# Patient Record
Sex: Male | Born: 1970 | Race: White | Hispanic: No | Marital: Married | Smoking: Never smoker
Health system: Southern US, Community
[De-identification: ages and names within clinical notes are randomized; demographics above are authoritative.]

## PROBLEM LIST (undated history)

## (undated) DIAGNOSIS — K219 Gastro-esophageal reflux disease without esophagitis: Secondary | ICD-10-CM

## (undated) DIAGNOSIS — E7800 Pure hypercholesterolemia, unspecified: Secondary | ICD-10-CM

## (undated) DIAGNOSIS — I10 Essential (primary) hypertension: Secondary | ICD-10-CM

## (undated) HISTORY — DX: Gastro-esophageal reflux disease without esophagitis: K21.9

## (undated) HISTORY — PX: OTHER SURGICAL HISTORY: SHX169

## (undated) HISTORY — DX: Essential (primary) hypertension: I10

## (undated) HISTORY — DX: Pure hypercholesterolemia, unspecified: E78.00

---

## 2012-09-14 ENCOUNTER — Emergency Department (HOSPITAL_BASED_OUTPATIENT_CLINIC_OR_DEPARTMENT_OTHER)
Admission: EM | Admit: 2012-09-14 | Discharge: 2012-09-14 | Disposition: A | Discharge status: Home / Self Care | Payer: BC Managed Care – PPO | Attending: Emergency Medicine | Admitting: Emergency Medicine

## 2012-09-14 ENCOUNTER — Emergency Department (HOSPITAL_BASED_OUTPATIENT_CLINIC_OR_DEPARTMENT_OTHER): Payer: BC Managed Care – PPO

## 2012-09-14 ENCOUNTER — Encounter (HOSPITAL_BASED_OUTPATIENT_CLINIC_OR_DEPARTMENT_OTHER): Payer: Self-pay | Admitting: *Deleted

## 2012-09-14 DIAGNOSIS — R209 Unspecified disturbances of skin sensation: Secondary | ICD-10-CM | POA: Insufficient documentation

## 2012-09-14 DIAGNOSIS — S43499A Other sprain of unspecified shoulder joint, initial encounter: Secondary | ICD-10-CM | POA: Insufficient documentation

## 2012-09-14 DIAGNOSIS — Y92838 Other recreation area as the place of occurrence of the external cause: Secondary | ICD-10-CM | POA: Insufficient documentation

## 2012-09-14 DIAGNOSIS — Y9364 Activity, baseball: Secondary | ICD-10-CM | POA: Insufficient documentation

## 2012-09-14 DIAGNOSIS — S46819A Strain of other muscles, fascia and tendons at shoulder and upper arm level, unspecified arm, initial encounter: Secondary | ICD-10-CM | POA: Insufficient documentation

## 2012-09-14 DIAGNOSIS — W219XXA Striking against or struck by unspecified sports equipment, initial encounter: Secondary | ICD-10-CM | POA: Insufficient documentation

## 2012-09-14 DIAGNOSIS — X500XXA Overexertion from strenuous movement or load, initial encounter: Secondary | ICD-10-CM | POA: Insufficient documentation

## 2012-09-14 DIAGNOSIS — S46212A Strain of muscle, fascia and tendon of other parts of biceps, left arm, initial encounter: Secondary | ICD-10-CM

## 2012-09-14 DIAGNOSIS — Y9239 Other specified sports and athletic area as the place of occurrence of the external cause: Secondary | ICD-10-CM | POA: Insufficient documentation

## 2012-09-14 MED ORDER — OXYCODONE-ACETAMINOPHEN 5-325 MG PO TABS: 1.0000 | ORAL_TABLET | ORAL | Status: DC | PRN

## 2012-09-14 NOTE — ED Provider Notes (Signed)
Medical screening examination/treatment/procedure(s) were conducted as a shared visit with non-physician practitioner(s) and myself.  I personally evaluated the patient during the encounter  L upper arm pain after swing baseball bat.  Deformity to L bicep, dimpling to skin. +2 Radial pulse, cardinal hand movements intact, flexion and extension intact with pain. Concern for biceps/tendon rupture.  Glynn Octave, MD 09/14/12 [phone]

## 2012-09-14 NOTE — ED Provider Notes (Signed)
History     CSN: [id number]  Arrival date & time 09/14/12  2008   First MD Initiated Contact with Patient 09/14/12 2029      Chief Complaint  Patient presents with  . Arm Injury    (Consider location/radiation/quality/duration/timing/severity/associated sxs/prior treatment) Patient is a 42 y.o. male presenting with arm injury. The history is provided by the patient.  Arm Injury  Patient presents to the ED for left upper arm injury.  Patient states he was at softball practice earlier, he swung a bat and hit himself in his left upper arm with the bat. Immediately he felt a "pop" and his bicep "looked like a softball."  He endorses some paresthesias of left thumb, index, and middle fingers.  Denies any numbness.  Patient has never had a left arm injury before. He is not established with orthopedics. He is right-hand dominant.  History reviewed. No pertinent past medical history.  History reviewed. No pertinent past surgical history.  History reviewed. No pertinent family history.  History  Substance Use Topics  . Smoking status: Never Smoker   . Smokeless tobacco: Not on file  . Alcohol Use: No      Review of Systems  Musculoskeletal: Positive for myalgias.  All other systems reviewed and are negative.    Allergies  Review of patient's allergies indicates no known allergies.  Home Medications   Current Outpatient Rx  Name  Route  Sig  Dispense  Refill  . loratadine (CLARITIN) 10 MG tablet   Oral   Take 10 mg by mouth daily.           BP 128/87  Pulse 81  Temp(Src) 98.4 F (36.9 C) (Oral)  Resp 16  Ht 5\' 8"  (1.727 m)  Wt 200 lb (90.719 kg)  BMI 30.42 kg/m2  SpO2 100%  Physical Exam  Nursing note and vitals reviewed. Constitutional: He is oriented to person, place, and time. He appears well-developed and well-nourished. No distress.  HENT:  Head: Normocephalic and atraumatic.  Eyes: Conjunctivae and EOM are normal.  Neck: Normal range of motion. Neck  supple.  Cardiovascular: Normal rate, regular rhythm and normal heart sounds.   Pulmonary/Chest: Effort normal and breath sounds normal. No respiratory distress.  Musculoskeletal: Normal range of motion.       Left upper arm: He exhibits tenderness, swelling and deformity. He exhibits no bony tenderness, no edema and no laceration.       Arms: TTP of left distal bicep with "popeye" appearance of bicep muscle- concern for bicep rupture; strong radial pulse and cap refill, sensation intact  Neurological: He is alert and oriented to person, place, and time.  Skin: Skin is warm and dry.  Psychiatric: He has a normal mood and affect.    ED Course  Procedures (including critical care time)  Labs Reviewed - No data to display Dg Elbow Complete Left  09/14/2012   *RADIOLOGY REPORT*  Clinical Data: Left arm pain.  LEFT ELBOW - COMPLETE 3+ VIEW  Comparison: No priors.  Findings: Four views of the left elbow demonstrate no acute displaced fracture, subluxation, dislocation, joint or soft tissue abnormality.  IMPRESSION: No acute radiographic abnormality of the left elbow.   Original Report Authenticated By: Trudie Reed, M.D.   Dg Humerus Left  09/14/2012   *RADIOLOGY REPORT*  Clinical Data: Left upper arm pain.  LEFT HUMERUS - 2+ VIEW  Comparison: No priors.  Findings: AP and lateral views of the left humerus demonstrate no acute displaced fracture.  No definite soft tissue abnormality appreciated.  IMPRESSION: 1.  No acute radiographic abnormality of the left humerus.   Original Report Authenticated By: Trudie Reed, M.D.     1. Biceps tendon rupture, left, initial encounter       MDM   Pt appears to have left distal bicep rupture.  Consulted ortho, Dr. Victorino Dike, he will schedule pt appt for appt and MRI tomorrow for further evaluation.  Arm sling placed.  Rx percocet.  Discussed plan with pt, he agreed.  Return precautions advised.         Garlon Hatchet, PA-C 09/14/12 2212

## 2012-09-14 NOTE — ED Notes (Signed)
Pt c/o left upper arm injury from baseball bat while playing ball x 1 hr ago

## 2014-01-10 IMAGING — CR DG ELBOW COMPLETE 3+V*L*
4 series · 4 of 4 positions shown · non-contrast
Comparison: No priors.

CLINICAL DATA: Left arm pain.

LEFT ELBOW - COMPLETE 3+ VIEW

[x elbow joint ap left]
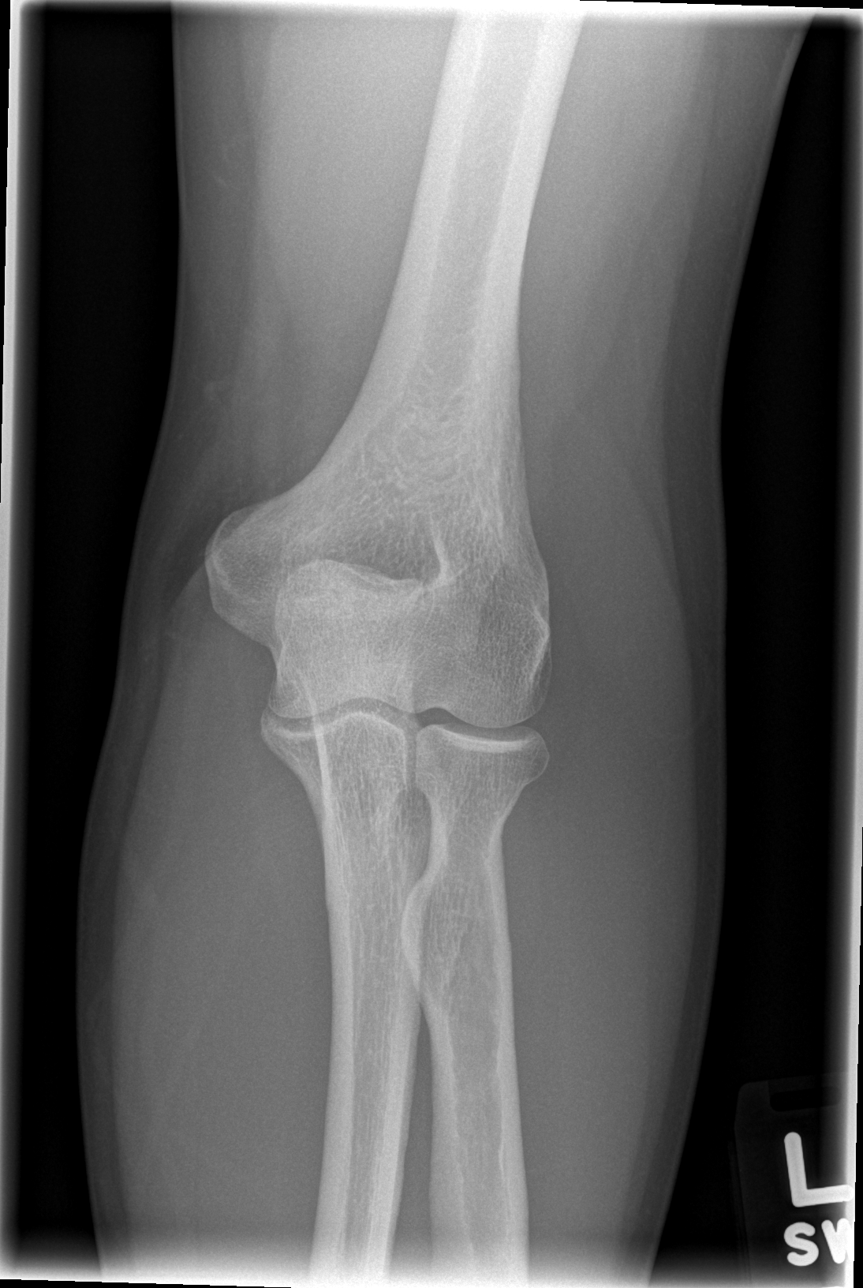

[x elbow joint obl. left (1 of 2)]
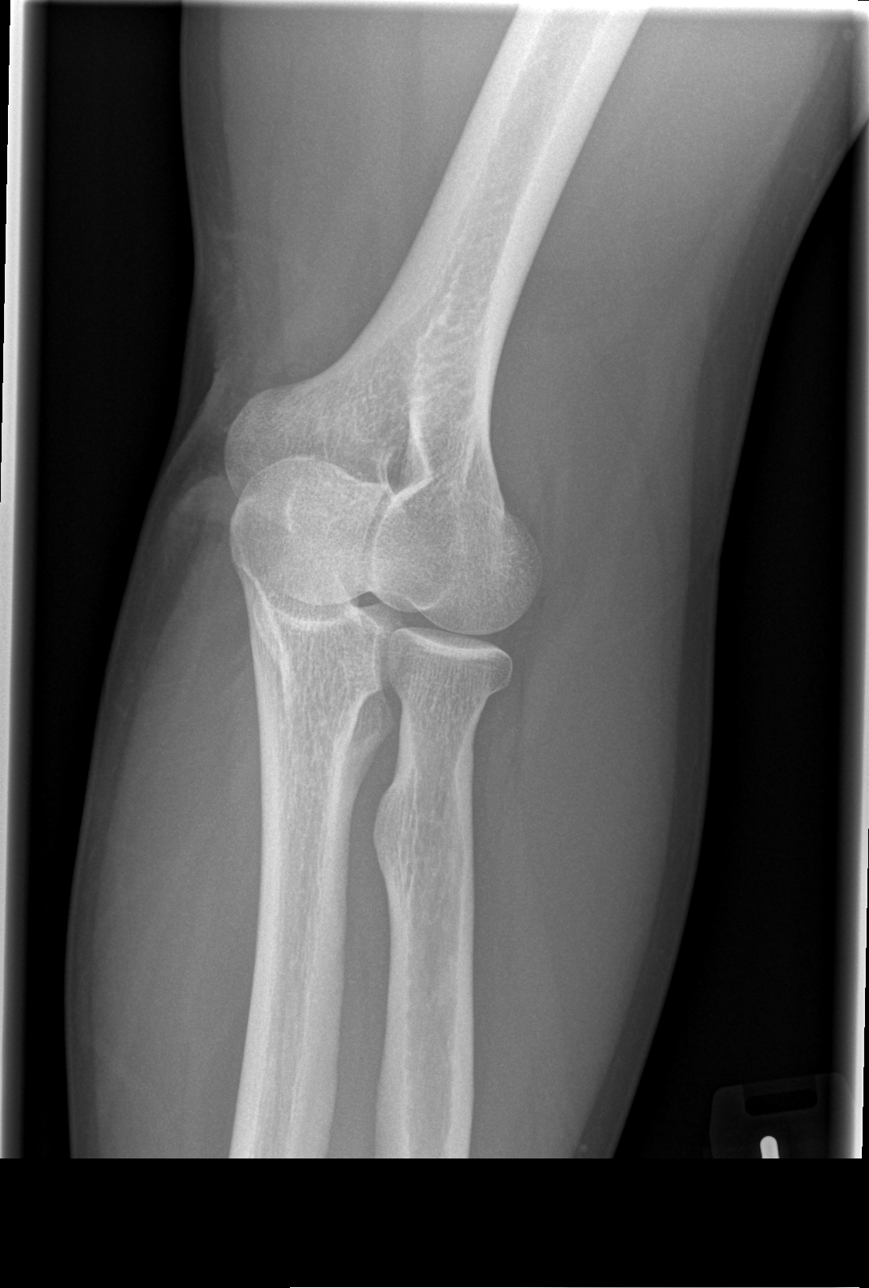

[x elbow joint obl. left (2 of 2)]
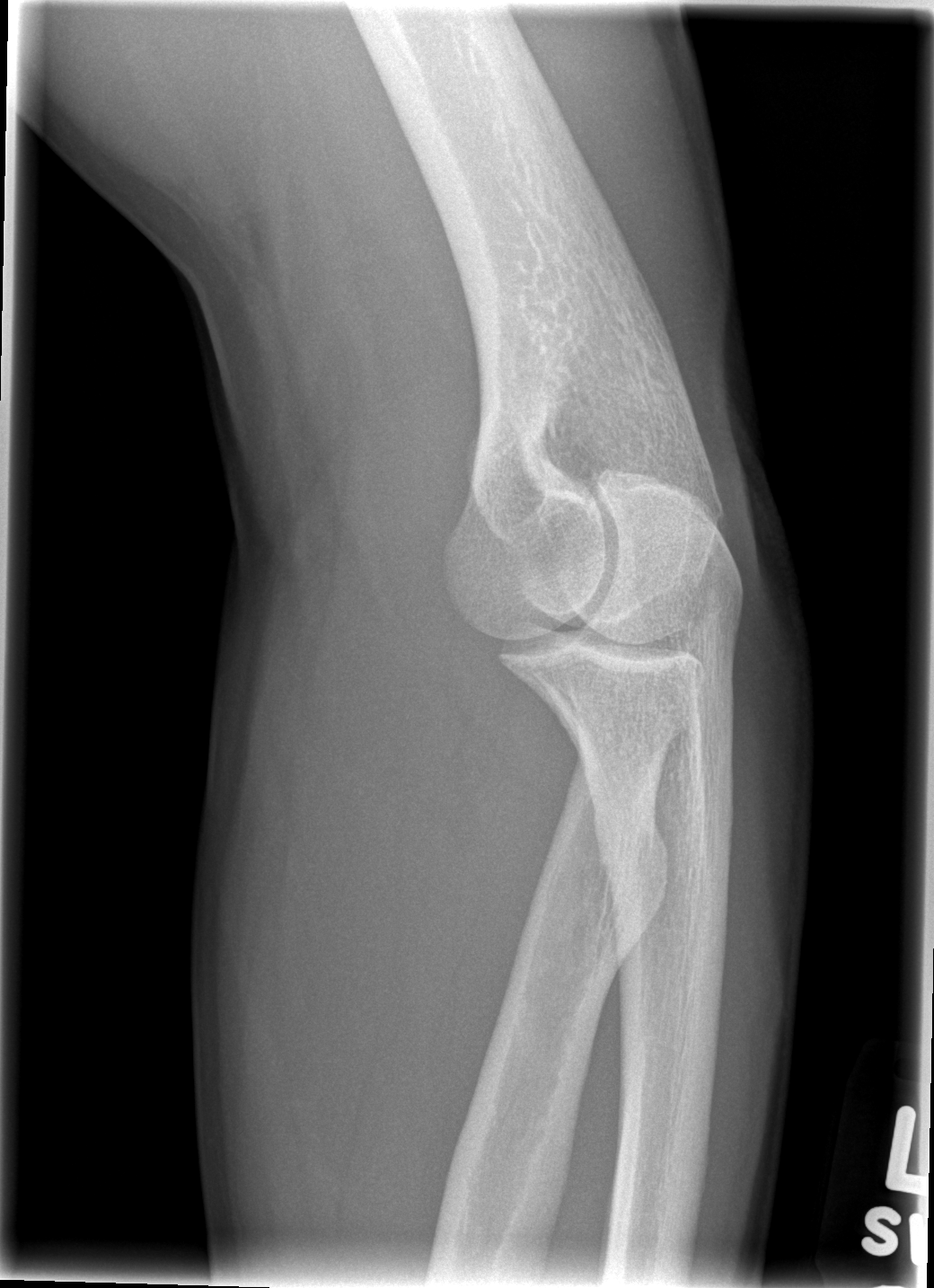

[x elbow joint lat left]
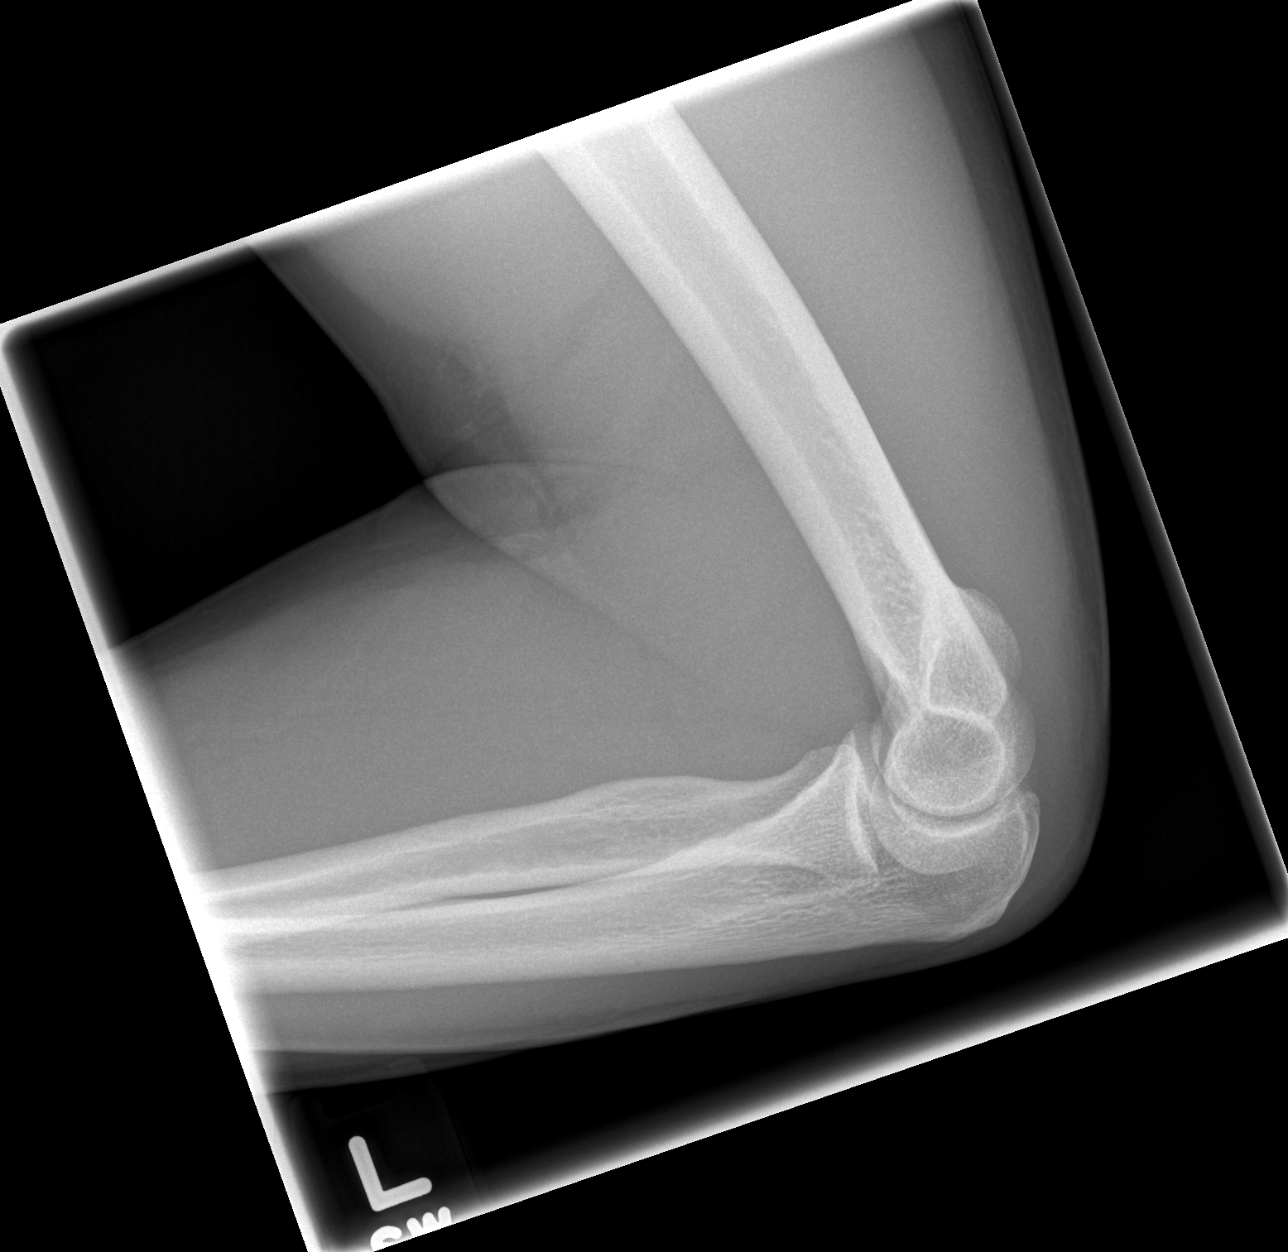

[4 of 4 positions shown; findings below may reference images not displayed]

FINDINGS: Four views of the left elbow demonstrate no acute
displaced fracture, subluxation, dislocation, joint or soft tissue
abnormality.
IMPRESSION: No acute radiographic abnormality of the left elbow.

## 2015-11-25 ENCOUNTER — Encounter: Payer: Self-pay | Admitting: Neurology

## 2015-11-25 DIAGNOSIS — F419 Anxiety disorder, unspecified: Secondary | ICD-10-CM | POA: Diagnosis not present

## 2015-11-25 DIAGNOSIS — G459 Transient cerebral ischemic attack, unspecified: Secondary | ICD-10-CM | POA: Diagnosis not present

## 2015-11-25 DIAGNOSIS — I160 Hypertensive urgency: Secondary | ICD-10-CM | POA: Diagnosis not present

## 2015-11-26 DIAGNOSIS — I160 Hypertensive urgency: Secondary | ICD-10-CM | POA: Diagnosis not present

## 2015-11-26 DIAGNOSIS — G459 Transient cerebral ischemic attack, unspecified: Secondary | ICD-10-CM | POA: Diagnosis not present

## 2015-11-26 DIAGNOSIS — F419 Anxiety disorder, unspecified: Secondary | ICD-10-CM | POA: Diagnosis not present

## 2016-01-26 ENCOUNTER — Ambulatory Visit (INDEPENDENT_AMBULATORY_CARE_PROVIDER_SITE_OTHER): Payer: BLUE CROSS/BLUE SHIELD | Admitting: Neurology

## 2016-01-26 ENCOUNTER — Encounter: Payer: Self-pay | Admitting: Neurology

## 2016-01-26 VITALS — BP 144/93 | HR 73 | Ht 68.0 in | Wt 206.0 lb

## 2016-01-26 DIAGNOSIS — I679 Cerebrovascular disease, unspecified: Secondary | ICD-10-CM

## 2016-01-26 NOTE — Progress Notes (Signed)
Reason for visit: TIA  Referring physician: Dr. Normand Sloop is a 45 y.o. male  History of present illness:  Timothy Livingston is a 45 year old right-handed white male with a history of an event that occurred around 11/24/2015. The patient had been traveling from [place] to [place], and somewhere in [place] he began noting left arm numbness and some clumsiness of the left arm. The patient was taken to a local hospital, CT of the head was done and was unremarkable. The symptoms resolved within about 15 minutes. The patient went to [facility], a workup was done that included a MRI of the brain that did not show an acute stroke event. 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 No acute changes were seen. The patient indicated that he was on baby aspirin at the time of the event. The patient underwent a carotid Doppler study and a 2-D echocardiogram, the written results of these studies are not available to me. The patient was told this was relatively unremarkable. The patient denies any chest pain or palpitations of the heart. He does have some gastroesophageal reflux disease. He was noted to have an elevated blood pressure at [facility] of 230/110. He believes that his systolic blood pressure in [place] was 180. The patient has not had any residual symptoms of numbness or weakness, no problems with balance or difficulty controlling the bowels or the bladder. He denies any headache or visual disturbance. He is sent to this office for an evaluation. He is now checking his blood pressures, they are running in the 110-120 range systolic, he has been placed on a blood pressure medication.  Past Medical History:  Diagnosis Date  . High cholesterol     History reviewed. No pertinent surgical history.  Family History  Problem Relation Age of Onset  . Skin cancer Mother   . Cancer Father   . Stroke Maternal Grandfather     Social  history:  reports that he has never smoked. He has never used smokeless tobacco. He reports that he does not drink alcohol or use drugs.  Medications:  Prior to Admission medications   Medication Sig Start Date End Date Taking? Authorizing Provider  aspirin EC 81 MG tablet Take 81 mg by mouth daily.   Yes Historical Provider, MD  escitalopram (LEXAPRO) 10 MG tablet  12/26/15  Yes Historical Provider, MD  Boris Lown Oil 500 MG CAPS Take 1 capsule by mouth daily.   Yes Historical Provider, MD  loratadine (CLARITIN) 10 MG tablet Take 10 mg by mouth daily.   Yes Historical Provider, MD  losartan (COZAAR) 25 MG tablet  12/14/15  Yes Historical Provider, MD  lovastatin (MEVACOR) 10 MG tablet  12/29/15  Yes Historical Provider, MD  metoprolol succinate (TOPROL-XL) 25 MG 24 hr tablet  12/29/15  Yes Historical Provider, MD  omeprazole (PRILOSEC) 20 MG capsule Take 20 mg by mouth daily.   Yes Historical Provider, MD     No Known Allergies  ROS:  Out of a complete 14 system review of symptoms, the patient complains only of the following symptoms, and all other reviewed systems are negative.  Skin rash  Blood pressure (!) 144/93, pulse 73, height 5\' 8"  (1.727 m), weight 206 lb (93.4 kg).  Physical Exam  General: The patient is alert and cooperative at the time of the examination.  Eyes: Pupils are equal, round, and reactive to light. Discs are flat bilaterally.  Neck: The neck is supple, no carotid bruits are noted.  Respiratory: The respiratory examination is clear.  Cardiovascular: The cardiovascular examination reveals a regular rate and rhythm, no obvious murmurs or rubs are noted.  Skin: Extremities are without significant edema.  Neurologic Exam  Mental status: The patient is alert and oriented x 3 at the time of the examination. The patient has apparent normal recent and remote memory, with an apparently normal attention span and concentration ability.  Cranial nerves: Facial symmetry is  present. There is good sensation of the face to pinprick and soft touch bilaterally. The strength of the facial muscles and the muscles to head turning and shoulder shrug are normal bilaterally. Speech is well enunciated, no aphasia or dysarthria is noted. Extraocular movements are full. Visual fields are full. The tongue is midline, and the patient has symmetric elevation of the soft palate. No obvious hearing deficits are noted.  Motor: The motor testing reveals 5 over 5 strength of all 4 extremities. Good symmetric motor tone is noted throughout.  Sensory: Sensory testing is intact to pinprick, soft touch, vibration sensation, and position sense on all 4 extremities. No evidence of extinction is noted.  Coordination: Cerebellar testing reveals good finger-nose-finger and heel-to-shin bilaterally.  Gait and station: Gait is normal. Tandem gait is normal. Romberg is negative. No drift is seen.  Reflexes: Deep tendon reflexes are symmetric and normal bilaterally. Toes are downgoing bilaterally.   Assessment/Plan:  1. TIA event  The patient appears to have hypertension as a main risk factor for stroke or TIA. The patient is now starting to exercise, avoid salt in the diet, and he is trying to lose weight. These measures should significantly improve his risk for TIA or stroke recurrence in the future. He was on low-dose aspirin at the time of the event, I would increase the dose to 325 mg if he can tolerate this with his reflux problems. The patient will follow-up through this office on an as-needed basis. We will try to obtain the results of the 2-D echocardiogram and the carotid Doppler study.  Marlan Palau. Keith Willis MD 01/26/2016 11:53 AM  [facility] [address] [place], [place] [id number]  Phone [phone] Fax [phone]

## 2016-01-26 NOTE — Patient Instructions (Addendum)
Stroke Prevention Some medical conditions and behaviors are associated with an increased chance of having a stroke. You may prevent a stroke by making healthy choices and managing medical conditions. HOW CAN I REDUCE MY RISK OF HAVING A STROKE?   Stay physically active. Get at least 30 minutes of activity on most or all days.  Do not smoke. It may also be helpful to avoid exposure to secondhand smoke.  Limit alcohol use. Moderate alcohol use is considered to be:  No more than 2 drinks per day for men.  No more than 1 drink per day for nonpregnant women.  Eat healthy foods. This involves:  Eating 5 or more servings of fruits and vegetables a day.  Making dietary changes that address high blood pressure (hypertension), high cholesterol, diabetes, or obesity.  Manage your cholesterol levels.  Making food choices that are high in fiber and low in saturated fat, trans fat, and cholesterol may control cholesterol levels.  Take any prescribed medicines to control cholesterol as directed by your health care provider.  Manage your diabetes.  Controlling your carbohydrate and sugar intake is recommended to manage diabetes.  Take any prescribed medicines to control diabetes as directed by your health care provider.  Control your hypertension.  Making food choices that are low in salt (sodium), saturated fat, trans fat, and cholesterol is recommended to manage hypertension.  Ask your health care provider if you need treatment to lower your blood pressure. Take any prescribed medicines to control hypertension as directed by your health care provider.  If you are 18-39 years of age, have your blood pressure checked every 3-5 years. If you are 40 years of age or older, have your blood pressure checked every year.  Maintain a healthy weight.  Reducing calorie intake and making food choices that are low in sodium, saturated fat, trans fat, and cholesterol are recommended to manage  weight.  Stop drug abuse.  Avoid taking birth control pills.  Talk to your health care provider about the risks of taking birth control pills if you are over 35 years old, smoke, get migraines, or have ever had a blood clot.  Get evaluated for sleep disorders (sleep apnea).  Talk to your health care provider about getting a sleep evaluation if you snore a lot or have excessive sleepiness.  Take medicines only as directed by your health care provider.  For some people, aspirin or blood thinners (anticoagulants) are helpful in reducing the risk of forming abnormal blood clots that can lead to stroke. If you have the irregular heart rhythm of atrial fibrillation, you should be on a blood thinner unless there is a good reason you cannot take them.  Understand all your medicine instructions.  Make sure that other conditions (such as anemia or atherosclerosis) are addressed. SEEK IMMEDIATE MEDICAL CARE IF:   You have sudden weakness or numbness of the face, arm, or leg, especially on one side of the body.  Your face or eyelid droops to one side.  You have sudden confusion.  You have trouble speaking (aphasia) or understanding.  You have sudden trouble seeing in one or both eyes.  You have sudden trouble walking.  You have dizziness.  You have a loss of balance or coordination.  You have a sudden, severe headache with no known cause.  You have new chest pain or an irregular heartbeat. Any of these symptoms may represent a serious problem that is an emergency. Do not wait to see if the symptoms will   go away. Get medical help at once. Call your local emergency services (911 in [place] Do not drive yourself to the hospital.   This information is not intended to replace advice given to you by your health care provider. Make sure you discuss any questions you have with your health care provider.   Document Released: 04/25/2004 Document Revised: 04/08/2014 Document Reviewed:  09/18/2012 Elsevier Interactive Patient Education 2016 Elsevier Inc.  

## 2016-01-29 ENCOUNTER — Telehealth: Payer: Self-pay

## 2016-01-29 NOTE — Telephone Encounter (Signed)
-----   Message from Charles K Willis, MYork Spaniel sent at 01/26/2016  2:56 PM EDT ----- Please call [facility] and get report of carotid doppler and 2D echo sent  To our office. Thanks.

## 2016-01-29 NOTE — Telephone Encounter (Signed)
Called [facility] to request results of carotid doppler and 2D echo. Also faxed request to F # [phone].

## 2016-01-30 ENCOUNTER — Telehealth: Payer: Self-pay | Admitting: Neurology

## 2016-01-30 NOTE — Telephone Encounter (Signed)
I have received the results of the 2-D echocardiogram done at [facility], this showed normal left ventricular size, and normal thickness. Ejection fraction was 60-65%. Mild tricuspid regurgitation was seen. The study was otherwise unremarkable.  The carotid Doppler study results showed mild bilateral carotid bifurcation and proximal internal carotid artery plaque, less than 50% stenosis.

## 2016-08-30 LAB — LAB REPORT - SCANNED: eGFR: >60

## 2016-09-18 LAB — LAB REPORT - SCANNED: eGFR: >60

## 2017-11-27 ENCOUNTER — Encounter: Payer: BLUE CROSS/BLUE SHIELD | Admitting: Sports Medicine

## 2017-11-27 ENCOUNTER — Other Ambulatory Visit: Payer: Self-pay

## 2017-11-28 NOTE — Progress Notes (Signed)
PATIENT CANCELED This encounter was created in error - please disregard. 

## 2022-03-28 ENCOUNTER — Encounter: Payer: Self-pay | Admitting: Gastroenterology

## 2022-05-07 ENCOUNTER — Ambulatory Visit: Payer: BLUE CROSS/BLUE SHIELD | Admitting: Gastroenterology

## 2022-06-07 ENCOUNTER — Encounter: Payer: Self-pay | Admitting: Gastroenterology

## 2022-06-07 ENCOUNTER — Ambulatory Visit: Payer: BC Managed Care – PPO | Admitting: Gastroenterology

## 2022-06-07 VITALS — BP 132/80 | HR 88 | Ht 68.0 in | Wt 230.0 lb

## 2022-06-07 DIAGNOSIS — Z1212 Encounter for screening for malignant neoplasm of rectum: Secondary | ICD-10-CM

## 2022-06-07 DIAGNOSIS — Z1211 Encounter for screening for malignant neoplasm of colon: Secondary | ICD-10-CM

## 2022-06-07 MED ORDER — CLENPIQ 10-3.5-12 MG-GM -GM/175ML PO SOLN: 1.0000 | ORAL | 0 refills | Status: DC

## 2022-06-07 NOTE — Patient Instructions (Addendum)
_______________________________________________________  If your blood pressure at your visit was 140/90 or greater, please contact your primary care physician to follow up on this.  _______________________________________________________  If you are age 53 or older, your body mass index should be between 23-30. Your Body mass index is 34.97 kg/m. If this is out of the aforementioned range listed, please consider follow up with your Primary Care Provider.  If you are age 14 or younger, your body mass index should be between 19-25. Your Body mass index is 34.97 kg/m. If this is out of the aformentioned range listed, please consider follow up with your Primary Care Provider.   ________________________________________________________  The [facility] providers would like to encourage you to use [facility] to communicate with providers for non-urgent requests or questions.  Due to long hold times on the telephone, sending your provider a message by [facility] may be a faster and more efficient way to get a response.  Please allow 48 business hours for a response.  Please remember that this is for non-urgent requests.  _______________________________________________________  We have sent the following medications to your pharmacy for you to pick up at your convenience: Clenpiq  You have been scheduled for a colonoscopy. Please follow written instructions given to you at your visit today.  Please pick up your prep supplies at the pharmacy within the next 1-3 days. If you use inhalers (even only as needed), please bring them with you on the day of your procedure.  Thank you,  Dr. Jackquline Denmark

## 2022-06-07 NOTE — Progress Notes (Addendum)
Chief Complaint: For colonoscopy  Referring Provider:  Dr Venetia Maxon      ASSESSMENT AND PLAN;   #1.  Portal sceening #2.  Recent acute gastroenteritis (resolved)   Plan; -Colon June 2024   Discussed risks & benefits of colonoscopy. Risks including rare perforation req laparotomy, bleeding after bx/polypectomy req blood transfusion, rarely missing neoplasms, risks of anesthesia/sedation, rare risk of damage to internal organs. Benefits outweigh the risks. Patient agrees to proceed. All the questions were answered. Pt consents to proceed.  HPI:    Timothy Livingston is a 52 y.o. male  With H/O HTN, HLD, well-controlled GERD on omeprazole  Had a recent bout of acute gastroenteritis It "ran through the whole house" including wife and kids. The diarrhea has resolved. He did not take any antibiotics.  Thought to be viral.  Here for colorectal cancer screening.  Doing well.  No nausea, vomiting, heartburn (on omeprazole 20 QD x 4 yrs), regurgitation, odynophagia or dysphagia.  No significant diarrhea or constipation.  No melena or hematochezia. No unintentional weight loss. No abdominal pain.  No FH CRC.  Occ social drinker.   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  Has 3 boys and 1 girl.    Past Medical History:  Diagnosis Date   High cholesterol    Hypertension     History reviewed. No pertinent surgical history.  Family History  Problem Relation Age of Onset   Skin cancer Mother    Cancer Father    Stroke Maternal Grandfather    Colon cancer Neg Hx    Stomach cancer Neg Hx    Esophageal cancer Neg Hx     Social History   Tobacco Use   Smoking status: Never   Smokeless tobacco: Never  Vaping Use   Vaping Use: Never used  Substance Use Topics   Alcohol use: No   Drug use: No    Current Outpatient Medications  Medication Sig Dispense Refill   aspirin EC 81 MG tablet Take 81 mg by mouth daily.        0   escitalopram (LEXAPRO) 10 MG  tablet   0   Krill Oil 500 MG CAPS Take 1 capsule by mouth daily.     loratadine (CLARITIN) 10 MG tablet Take 10 mg by mouth daily.     losartan (COZAAR) 25 MG tablet   4   lovastatin (MEVACOR) 10 MG tablet   0      0   metoprolol succinate (TOPROL-XL) 25 MG 24 hr tablet   0   omeprazole (PRILOSEC) 20 MG capsule Take 20 mg by mouth daily.     No current facility-administered medications for this visit.    No Known Allergies  Review of Systems:  Constitutional: Denies fever, chills, diaphoresis, appetite change and fatigue.  HEENT: Denies photophobia, eye pain, redness, hearing loss, ear pain, congestion, sore throat, rhinorrhea, sneezing, mouth sores, neck pain, neck stiffness and tinnitus.   Respiratory: Denies SOB, DOE, cough, chest tightness,  and wheezing.   Cardiovascular: Denies chest pain, palpitations and leg swelling.  Genitourinary: Denies dysuria, urgency, frequency, hematuria, flank pain and difficulty urinating.  Musculoskeletal: Denies myalgias, back pain, joint swelling, arthralgias and gait problem.  Skin: No rash.  Neurological: Denies dizziness, seizures, syncope, weakness, light-headedness, numbness and occ headaches.  Hematological: Denies adenopathy. Easy bruising, personal or family bleeding history  Psychiatric/Behavioral: No anxiety or depression     Physical Exam:    BP 132/80   Pulse 88  Ht '5\' 8"'$  (1.727 m)   Wt 230 lb (104.3 kg)   BMI 34.97 kg/m  Wt Readings from Last 3 Encounters:  06/07/22 230 lb (104.3 kg)  01/26/16 206 lb (93.4 kg)  09/14/12 200 lb (90.7 kg)   Constitutional:  Well-developed, in no acute distress. Psychiatric: Normal mood and affect. Behavior is normal. HEENT: Pupils normal.  Conjunctivae are normal. No scleral icterus. Cardiovascular: Normal rate, regular rhythm. No edema Pulmonary/chest: Effort normal and breath sounds normal. No wheezing, rales or rhonchi. Abdominal: Soft, nondistended. Nontender. Bowel sounds active  throughout. There are no masses palpable. No hepatomegaly. Rectal: Deferred Neurological: Alert and oriented to person place and time. Skin: Skin is warm and dry. No rashes noted.      Carmell Austria, MD 06/07/2022, 10:23 AM  Cc: Dr. Venetia Maxon.

## 2022-07-15 ENCOUNTER — Telehealth: Payer: Self-pay

## 2022-07-15 NOTE — Telephone Encounter (Signed)
Patient needs a previsit before his colonoscopy date. No less than 30 days prior. LVM for patient to schedule this due to new policy

## 2022-07-17 NOTE — Telephone Encounter (Signed)
Made 5-15 with patient as telephone call

## 2022-08-14 ENCOUNTER — Telehealth: Payer: Self-pay | Admitting: *Deleted

## 2022-08-14 NOTE — Telephone Encounter (Signed)
No return call received pre-visit and procedure canceled no show letter mailed. 

## 2022-08-14 NOTE — Telephone Encounter (Signed)
Attempted to call x 3 message left with call back # to return call to reschedule pre-visit or upcoming procedure on 09/10/22 will be canceled.

## 2022-08-23 ENCOUNTER — Encounter: Payer: Self-pay | Admitting: Gastroenterology

## 2022-09-10 ENCOUNTER — Encounter: Payer: BC Managed Care – PPO | Admitting: Gastroenterology

## 2022-09-30 ENCOUNTER — Encounter: Payer: Self-pay | Admitting: Gastroenterology

## 2022-10-10 ENCOUNTER — Ambulatory Visit (AMBULATORY_SURGERY_CENTER): Payer: BC Managed Care – PPO | Admitting: *Deleted

## 2022-10-10 ENCOUNTER — Encounter: Payer: Self-pay | Admitting: Gastroenterology

## 2022-10-10 VITALS — Ht 68.0 in | Wt 220.0 lb

## 2022-10-10 DIAGNOSIS — Z1211 Encounter for screening for malignant neoplasm of colon: Secondary | ICD-10-CM

## 2022-10-10 MED ORDER — CLENPIQ 10-3.5-12 MG-GM -GM/175ML PO SOLN: 1.0000 | ORAL | 0 refills | Status: DC

## 2022-10-10 NOTE — Progress Notes (Signed)
Pt's name and DOB verified at the beginning of the pre-visit.  Pt denies any difficulty with ambulating,sitting, laying down or rolling side to side Gave both [facility] main # and MD on call # prior to instructions.  No egg or soy allergy known to patient  No issues known to pt with past sedation with any surgeries or procedures Pt denies having issues being intubated Pt has no issues moving head neck or swallowing No FH of Malignant Hyperthermia Pt is not on diet pills Pt is not on home 02  Pt is not on blood thinners  Pt denies issues with constipation  Pt is not on dialysis Pt denise any abnormal heart rhythms  Pt denies any upcoming cardiac testing Pt encouraged to use to use Singlecare or Goodrx to reduce cost  Patient's chart reviewed by Cathlyn Parsons CNRA prior to pre-visit and patient appropriate for the [facility].  Pre-visit completed and red dot placed by patient's name on their procedure day (on provider's schedule).  . Visit by phone Pt states weight is 220 lb Instructed pt why it is important to and  to call if they have any changes in health or new medications. Directed them to the # given and on instructions.   Pt states they will.  Instructions reviewed with pt and pt states understanding. Instructed to review again prior to procedure. Pt states they will.  Instructions sent by mail

## 2022-10-16 ENCOUNTER — Encounter: Payer: BC Managed Care – PPO | Admitting: Gastroenterology

## 2022-12-10 ENCOUNTER — Encounter: Payer: BC Managed Care – PPO | Admitting: Gastroenterology

## 2022-12-11 ENCOUNTER — Encounter: Payer: Self-pay | Admitting: Gastroenterology

## 2023-03-12 ENCOUNTER — Encounter: Payer: Self-pay | Admitting: Gastroenterology

## 2023-04-04 ENCOUNTER — Encounter: Payer: BC Managed Care – PPO | Admitting: Gastroenterology

## 2023-05-12 ENCOUNTER — Telehealth: Payer: Self-pay

## 2023-05-12 NOTE — Telephone Encounter (Signed)
 Multiple unsuccessful telephone attempts to reach patient for his pre-visit appointment which was scheduled for today at 0900.  Message left for patient to call back and reschedule previsit before 5:00 pm today or his colonoscopy (scheduled for 05/28/23) will be cancelled.

## 2023-05-12 NOTE — Telephone Encounter (Signed)
 Patient did not call back to reschedule pre-visit.  Colonoscopy on 05/28/23 has been cancelled.  No show letter sent to patient.

## 2023-05-28 ENCOUNTER — Encounter: Payer: BC Managed Care – PPO | Admitting: Gastroenterology

## 2023-06-28 NOTE — Progress Notes (Unsigned)
 Subjective:  Patient ID: Timothy Livingston, male    DOB: [birth year]  Age: 53 y.o. MRN: [id number]  No chief complaint on file.  Discussed the use of AI scribe software for clinical note transcription with the patient, who gave verbal consent to proceed.        Well Adult Physical: Patient here for a comprehensive physical exam.The patient reports {problems:16946} Do you take any herbs or supplements that were not prescribed by a doctor? {yes/no/not asked:9010} Are you taking calcium supplements? {yes/no:63} Are you taking aspirin daily? {yes/no:63}  Encounter for general adult medical examination without abnormal findings  Physical ("At Risk" items are starred): Patient's last physical exam was 1 year ago .  Patient wears a seat belt, has smoke detectors, has carbon monoxide detectors, practices appropriate gun safety, and wears sunscreen with extended sun exposure. Dental Care: biannual cleanings, brushes and flosses daily. Ophthalmology/Optometry: Annual visit.  Hearing loss: none Vision impairments: none Last PSA:      No data to display                No data to display                    Past Medical History:  Diagnosis Date   High cholesterol    Hypertension    Past Surgical History:  Procedure Laterality Date   Fractured nose     surgery to straighten    Family History  Problem Relation Age of Onset   Skin cancer Mother    Cancer Father    Stroke Maternal Grandfather    Colon cancer Neg Hx    Stomach cancer Neg Hx    Esophageal cancer Neg Hx    Colon polyps Neg Hx    Rectal cancer Neg Hx    Social History   Socioeconomic History   Marital status: Married    Spouse name: Not on file   Number of children: 4   Years of education: 14   Highest education level: Not on file  Occupational History   Occupation: Field seismologist shop   Occupation: self employed Surveyor, minerals  Tobacco Use   Smoking status: Never   Smokeless tobacco: Never  Theatre manager   Vaping status: Never Used  Substance and Sexual Activity   Alcohol use: No   Drug use: No   Sexual activity: Never  Other Topics Concern   Not on file  Social History Narrative   Lives at home w/ his wife and 4 children   Social Drivers of Corporate investment banker Strain: Not on file  Food Insecurity: Not on file  Transportation Needs: Not on file  Physical Activity: Not on file  Stress: Not on file  Social Connections: Not on file   Review of Systems   Objective:  There were no vitals taken for this visit.     10/10/2022    8:04 AM 06/07/2022   10:12 AM 01/26/2016   11:38 AM  BP/Weight  Systolic BP  132 409  Diastolic BP  80 93  Wt. (Lbs) 220 230 206  BMI 33.45 kg/m2 34.97 kg/m2 31.32 kg/m2    Physical Exam  No results found for: "WBC", "HGB", "HCT", "PLT", "GLUCOSE", "CHOL", "TRIG", "HDL", "LDLDIRECT", "LDLCALC", "ALT", "AST", "NA", "K", "CL", "CREATININE", "BUN", "CO2", "TSH", "PSA", "INR", "GLUF", "HGBA1C", "MICROALBUR"    Assessment & Plan:  There are no diagnoses linked to this encounter.   Assessment and Plan  There is no height or weight on file to calculate BMI.   These are the goals we discussed:  Goals   None      This is a list of the screening recommended for you and due dates:  Health Maintenance  Topic Date Due   HIV Screening  Never done   Hepatitis C Screening  Never done   DTaP/Tdap/Td vaccine (1 - Tdap) Never done   Colon Cancer Screening  Never done   Zoster (Shingles) Vaccine (1 of 2) Never done   Flu Shot  Never done   COVID-19 Vaccine (1 - 2024-25 season) Never done   HPV Vaccine  Aged Out     No orders of the defined types were placed in this encounter.    Follow-up: No follow-ups on file.  An After Visit Summary was printed and given to the patient.  Langley Gauss, [place] [facility] [phone]

## 2023-07-01 ENCOUNTER — Ambulatory Visit: Payer: Self-pay | Admitting: Physician Assistant

## 2023-07-02 NOTE — Progress Notes (Signed)
 This encounter was created in error - please disregard.

## 2023-07-03 ENCOUNTER — Ambulatory Visit (INDEPENDENT_AMBULATORY_CARE_PROVIDER_SITE_OTHER): Payer: Self-pay | Admitting: Physician Assistant

## 2023-07-03 ENCOUNTER — Encounter: Payer: Self-pay | Admitting: Physician Assistant

## 2023-07-03 VITALS — BP 132/88 | HR 82 | Temp 98.0°F | Ht 68.0 in | Wt 227.0 lb

## 2023-07-03 DIAGNOSIS — K219 Gastro-esophageal reflux disease without esophagitis: Secondary | ICD-10-CM | POA: Insufficient documentation

## 2023-07-03 DIAGNOSIS — Z7689 Persons encountering health services in other specified circumstances: Secondary | ICD-10-CM | POA: Insufficient documentation

## 2023-07-03 DIAGNOSIS — I10 Essential (primary) hypertension: Secondary | ICD-10-CM | POA: Insufficient documentation

## 2023-07-03 DIAGNOSIS — E782 Mixed hyperlipidemia: Secondary | ICD-10-CM | POA: Diagnosis not present

## 2023-07-03 DIAGNOSIS — I679 Cerebrovascular disease, unspecified: Secondary | ICD-10-CM

## 2023-07-03 NOTE — Assessment & Plan Note (Signed)
 Controlled Denies any new or worsening symptoms Continue to monitor symptoms Will adjust treatment based on symptoms Continue taking Omeprazole 20mg  as directed.

## 2023-07-03 NOTE — Progress Notes (Signed)
 Subjective:  Patient ID: Timothy Livingston, male    DOB: [birth year]  Age: 53 y.o. MRN: [id number]  Chief Complaint  Patient presents with   New to establish    Patient is establishing as a new patient.  HPI  Discussed the use of AI scribe software for clinical note transcription with the patient, who gave verbal consent to proceed.  History of Present Illness   Timothy Livingston, a patient with a history of high blood pressure, high cholesterol, and a previous stroke, presents to establish care and discuss weight management. He is interested in starting Ozempic for weight loss but has concerns about the cost. He denies any known issues with blood sugar and has not had lab work done in the past year. He reports no known pancreatic issues. He has a family history of prostate cancer, with his father being diagnosed approximately ten years ago. He is unsure if he has had a PSA screening.  Eight years ago, Timothy Livingston experienced a stroke, which was initially diagnosed as Bell's palsy. He reports no residual weakness or other issues from the stroke. He had a CT scan in 2017, which showed mild chronic ischemia, indicative of a previous stroke. He has not seen a neurologist recently.  Timothy Livingston also has a history of a ruptured bicep, which occasionally causes pain if he forgets and tries to lift something heavy. He reports no other pain or abnormal bowel movements. He occasionally experiences constipation, which he attributes to his love of cheese.  He is currently taking Lipitor, omeprazole, metoprolol, and Lysteda. He also takes Claritin D for seasonal allergies, which he notes can sometimes increase his blood pressure. He monitors his blood pressure at home and reports it is generally within normal limits unless he becomes emotional or anxious.          07/03/2023   11:20 AM  Depression screen PHQ 2/9  Decreased Interest 0  Down, Depressed, Hopeless 0  PHQ - 2 Score 0         07/03/2023   11:19  AM  Fall Risk  Falls in the past year? 0  Was there an injury with Fall? 0  Fall Risk Category Calculator 0  Patient at Risk for Falls Due to No Fall Risks  Fall risk Follow up Falls evaluation completed     Current Outpatient Medications on File Prior to Visit  Medication Sig Dispense Refill   lovastatin (MEVACOR) 10 MG tablet Take 10 mg by mouth at bedtime.  0   metoprolol succinate (TOPROL-XL) 25 MG 24 hr tablet   0   omeprazole (PRILOSEC) 20 MG capsule Take 20 mg by mouth daily.     No current facility-administered medications on file prior to visit.  . 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   Occupation: self employed Surveyor, minerals  Tobacco Use   Smoking status: Never   Smokeless tobacco: Never  Vaping Use   Vaping status: Never Used  Substance and Sexual Activity   Alcohol use: No   Drug use: Never   Sexual activity: Not Currently  Other Topics Concern   Not on file  Social History Narrative   Lives at home w/ his wife and 4 children   Social Drivers of Health  Financial Resource Strain: Not on file  Food Insecurity: Not on file  Transportation Needs: Not on file  Physical Activity: Not on file  Stress: Not on file  Social Connections: Not on file   Past Medical History:  Diagnosis Date   GERD (gastroesophageal reflux disease)    High cholesterol    Hypertension    Family History  Problem Relation Age of Onset   Skin cancer Mother    Hyperlipidemia Father    COPD Father    Cancer Father    Stroke Maternal Grandfather    Colon cancer Neg Hx    Stomach cancer Neg Hx    Esophageal cancer Neg Hx    Colon polyps Neg Hx    Rectal cancer Neg Hx     Review of Systems  Constitutional:  Negative for appetite change, fatigue and fever.  HENT:  Negative  for congestion, ear pain, sinus pressure and sore throat.   Respiratory:  Negative for cough, chest tightness, shortness of breath and wheezing.   Cardiovascular:  Negative for chest pain and palpitations.  Gastrointestinal:  Negative for abdominal pain, constipation, diarrhea, nausea and vomiting.  Genitourinary:  Negative for dysuria and hematuria.  Musculoskeletal:  Negative for arthralgias, back pain, joint swelling and myalgias.  Skin:  Negative for rash.  Neurological:  Negative for dizziness, weakness and headaches.  Psychiatric/Behavioral:  Negative for dysphoric mood. The patient is not nervous/anxious.      Objective:  BP 132/88 (BP Location: Left Arm, Patient Position: Sitting)   Pulse 82   Temp 98 F (36.7 C) (Temporal)   Ht 5\' 8"  (1.727 m)   Wt 227 lb (103 kg)   SpO2 98%   BMI 34.52 kg/m      07/03/2023   11:20 AM 10/10/2022    8:04 AM 06/07/2022   10:12 AM  BP/Weight  Systolic BP 132  811  Diastolic BP 88  80  Wt. (Lbs) 227 220 230  BMI 34.52 kg/m2 33.45 kg/m2 34.97 kg/m2    Physical Exam Constitutional:      Appearance: Normal appearance.  HENT:     Right Ear: Tympanic membrane normal.     Left Ear: Tympanic membrane normal.     Nose: Nose normal.     Mouth/Throat:     Pharynx: No oropharyngeal exudate or posterior oropharyngeal erythema.  Eyes:     Conjunctiva/sclera: Conjunctivae normal.  Neck:     Vascular: No carotid bruit.  Cardiovascular:     Rate and Rhythm: Normal rate and regular rhythm.     Heart sounds: Normal heart sounds.  Pulmonary:     Effort: Pulmonary effort is normal.     Breath sounds: Normal breath sounds.  Abdominal:     General: Bowel sounds are normal.     Palpations: Abdomen is soft.     Tenderness: There is no abdominal tenderness.  Skin:    Findings: No lesion or rash.  Neurological:     Mental Status: He is alert and oriented to person, place, and time.  Psychiatric:        Behavior: Behavior normal.    Diabetic  Foot Exam - Simple   No data filed      No results found for: "WBC", "HGB", "HCT", "PLT", "GLUCOSE", "CHOL", "TRIG", "HDL", "LDLDIRECT", "LDLCALC", "ALT", "AST", "NA", "K", "CL", "CREATININE", "BUN", "CO2", "TSH", "PSA", "INR", "GLUF", "HGBA1C", "MICROALBUR"    Assessment & Plan:   Mixed hyperlipidemia Assessment & Plan: Controlled Labs drawn today Continue taking Lovastatin  10mg  Will adjust treatment depending on labs   Orders: -     Hemoglobin A1c -     Lipid panel  Gastroesophageal reflux disease, unspecified whether esophagitis present Assessment & Plan: Controlled Denies any new or worsening symptoms Continue to monitor symptoms Will adjust treatment based on symptoms Continue taking Omeprazole 20mg  as directed.   Orders: -     Comprehensive metabolic panel with GFR  Essential hypertension Assessment & Plan: Hypertension well-controlled at home, occasional clinic elevations likely due to white coat syndrome. Reports occasional low readings possibly from not eating post-medication. Metoprolol used for management. Claritin D may increase blood pressure. - Continue metoprolol. - Monitor blood pressure at home, report consistent high/low readings. - Advise caution with Claritin D, monitor blood pressure.  Orders: -     CBC with Differential/Platelet -     TSH -     PSA -     Testosterone,Free and Total  Encounter for weight management Assessment & Plan: Discussed Ozempic for weight management. Emphasized need to determine diabetes status before prescribing. Lab work needed to assess kidney and liver function due to potential liver aggravation by [facility]. - Order lab work to assess kidney and liver function. - Evaluate for diabetes before prescribing Ozempic or Wegovy.   Small vessel disease, cerebrovascular Assessment & Plan: Stroke 8 years ago, no residual symptoms. 2017 CT showed mild chronic ischemia, no acute issues. No recent neurologist follow-up. -  Obtain previous imaging records. - Consider follow-up imaging if indicated.      No orders of the defined types were placed in this encounter.    Orders Placed This Encounter  Procedures   CBC with Differential/Platelet   Comprehensive metabolic panel with GFR   Hemoglobin A1c   Lipid panel   TSH   PSA   Testosterone,Free and Total   General Health Maintenance Discussed prostate health monitoring due to family history of prostate cancer. Previous PSA screening records needed. Discussed thyroid and testosterone impact on weight/metabolism. Explained testosterone treatment risks for prostate cancer. - Obtain previous PSA screening records. - Order thyroid function tests. - Order testosterone levels. - Schedule follow-up in three months to review labs and adjust treatment.      I,Lauren M Auman,acting as a Neurosurgeon for [employer], [employer].,have documented all relevant documentation on the behalf of Langley Gauss, PA,as directed by  Langley Gauss, PA while in the presence of Langley Gauss, [place].    Follow-up: Return in about 3 months (around 10/02/2023) for Chronic, Huston Foley.  AVS was given to patient prior to departure.  Langley Gauss, [place] [facility] [phone]

## 2023-07-03 NOTE — Assessment & Plan Note (Signed)
 Discussed Ozempic for weight management. Emphasized need to determine diabetes status before prescribing. Lab work needed to assess kidney and liver function due to potential liver aggravation by [facility]. - Order lab work to assess kidney and liver function. - Evaluate for diabetes before prescribing Ozempic or Wegovy.

## 2023-07-03 NOTE — Assessment & Plan Note (Signed)
 Hypertension well-controlled at home, occasional clinic elevations likely due to white coat syndrome. Reports occasional low readings possibly from not eating post-medication. Metoprolol used for management. Claritin D may increase blood pressure. - Continue metoprolol. - Monitor blood pressure at home, report consistent high/low readings. - Advise caution with Claritin D, monitor blood pressure.

## 2023-07-03 NOTE — Patient Instructions (Signed)
 VISIT SUMMARY:  Timothy Livingston, you came in today to establish care and discuss weight management. We talked about your interest in starting Ozempic for weight loss and addressed your concerns about its cost. We also reviewed your history of high blood pressure, high cholesterol, and a previous stroke. Additionally, we discussed your family history of prostate cancer and your occasional issues with constipation.  YOUR PLAN:  -WEIGHT MANAGEMENT: We discussed the possibility of using Ozempic for weight loss, but we need to check your diabetes status first. We will also do lab work to assess your kidney and liver function, as these can be affected by the medication.  -HYPERTENSION: Your high blood pressure seems well-controlled at home, though it can be elevated in the clinic due to anxiety. You should continue taking metoprolol and monitor your blood pressure at home. Be cautious with Claritin D, as it can raise your blood pressure.  -STROKE: You had a stroke eight years ago but have no ongoing symptoms. We will obtain your previous imaging records and consider follow-up imaging if needed.  -GENERAL HEALTH MAINTENANCE: Given your family history of prostate cancer, we will obtain your previous PSA screening records. We will also check your thyroid function and testosterone levels, as these can impact your weight and metabolism. We will review these results and adjust your treatment as needed.  INSTRUCTIONS:  Please complete the lab work to assess your kidney and liver function, diabetes status, thyroid function, and testosterone levels. We will also obtain your previous PSA screening records and imaging records related to your stroke. Schedule a follow-up appointment in three months to review your lab results and adjust your treatment plan.

## 2023-07-03 NOTE — Assessment & Plan Note (Signed)
 Stroke 8 years ago, no residual symptoms. 2017 CT showed mild chronic ischemia, no acute issues. No recent neurologist follow-up. - Obtain previous imaging records. - Consider follow-up imaging if indicated.

## 2023-07-03 NOTE — Assessment & Plan Note (Signed)
 Controlled Labs drawn today Continue taking Lovastatin 10mg  Will adjust treatment depending on labs

## 2023-07-08 ENCOUNTER — Encounter: Payer: Self-pay | Admitting: Physician Assistant

## 2023-07-08 LAB — LIPID PANEL
Chol/HDL Ratio: 5.8 ratio — ABNORMAL HIGH (ref 0.0–5.0)
Cholesterol, Total: 213 mg/dL — ABNORMAL HIGH (ref 100–199)
HDL: 37 mg/dL — ABNORMAL LOW (ref >39)
LDL Chol Calc (NIH): 146 mg/dL — ABNORMAL HIGH (ref 0–99)
Triglycerides: 167 mg/dL — ABNORMAL HIGH (ref 0–149)
VLDL Cholesterol Cal: 30 mg/dL (ref 5–40)

## 2023-07-08 LAB — CBC WITH DIFFERENTIAL/PLATELET
Basophils Absolute: 0.1 10*3/uL (ref 0.0–0.2)
Basos: 2 %
EOS (ABSOLUTE): 0.2 10*3/uL (ref 0.0–0.4)
Eos: 3 %
Hematocrit: 47.5 % (ref 37.5–51.0)
Hemoglobin: 15.7 g/dL (ref 13.0–17.7)
Immature Grans (Abs): 0 10*3/uL (ref 0.0–0.1)
Immature Granulocytes: 0 %
Lymphocytes Absolute: 2.2 10*3/uL (ref 0.7–3.1)
Lymphs: 34 %
MCH: 29.3 pg (ref 26.6–33.0)
MCHC: 33.1 g/dL (ref 31.5–35.7)
MCV: 89 fL (ref 79–97)
Monocytes Absolute: 0.8 10*3/uL (ref 0.1–0.9)
Monocytes: 12 %
Neutrophils Absolute: 3.2 10*3/uL (ref 1.4–7.0)
Neutrophils: 49 %
Platelets: 362 10*3/uL (ref 150–450)
RBC: 5.36 x10E6/uL (ref 4.14–5.80)
RDW: 13 % (ref 11.6–15.4)
WBC: 6.5 10*3/uL (ref 3.4–10.8)

## 2023-07-08 LAB — COMPREHENSIVE METABOLIC PANEL WITH GFR
ALT: 57 IU/L — ABNORMAL HIGH (ref 0–44)
AST: 36 IU/L (ref 0–40)
Albumin: 5 g/dL — ABNORMAL HIGH (ref 3.8–4.9)
Alkaline Phosphatase: 104 IU/L (ref 44–121)
BUN/Creatinine Ratio: 12 (ref 9–20)
BUN: 12 mg/dL (ref 6–24)
Bilirubin Total: 0.3 mg/dL (ref 0.0–1.2)
CO2: 24 mmol/L (ref 20–29)
Calcium: 10.3 mg/dL — ABNORMAL HIGH (ref 8.7–10.2)
Chloride: 103 mmol/L (ref 96–106)
Creatinine, Ser: 0.97 mg/dL (ref 0.76–1.27)
Globulin, Total: 2.6 g/dL (ref 1.5–4.5)
Glucose: 119 mg/dL — ABNORMAL HIGH (ref 70–99)
Potassium: 5 mmol/L (ref 3.5–5.2)
Sodium: 141 mmol/L (ref 134–144)
Total Protein: 7.6 g/dL (ref 6.0–8.5)
eGFR: 92 mL/min/{1.73_m2} (ref >59)

## 2023-07-08 LAB — TESTOSTERONE,FREE AND TOTAL
Testosterone, Free: 3.4 pg/mL — ABNORMAL LOW (ref 7.2–24.0)
Testosterone: 284 ng/dL (ref 264–916)

## 2023-07-08 LAB — HEMOGLOBIN A1C
Est. average glucose Bld gHb Est-mCnc: 128 mg/dL
Hgb A1c MFr Bld: 6.1 % — ABNORMAL HIGH (ref 4.8–5.6)

## 2023-07-08 LAB — TSH: TSH: 1.97 u[IU]/mL (ref 0.450–4.500)

## 2023-07-08 LAB — PSA: Prostate Specific Ag, Serum: 0.5 ng/mL (ref 0.0–4.0)

## 2023-07-21 ENCOUNTER — Other Ambulatory Visit: Payer: Self-pay | Admitting: Physician Assistant

## 2023-07-21 DIAGNOSIS — E6609 Other obesity due to excess calories: Secondary | ICD-10-CM

## 2023-07-21 DIAGNOSIS — E782 Mixed hyperlipidemia: Secondary | ICD-10-CM

## 2023-07-21 MED ORDER — WEGOVY 0.25 MG/0.5ML ~~LOC~~ SOAJ: 0.2500 mg | SUBCUTANEOUS | 0 refills | Status: DC

## 2023-07-25 ENCOUNTER — Other Ambulatory Visit: Payer: Self-pay | Admitting: Physician Assistant

## 2023-07-25 DIAGNOSIS — E782 Mixed hyperlipidemia: Secondary | ICD-10-CM

## 2023-07-25 DIAGNOSIS — E66811 Obesity, class 1: Secondary | ICD-10-CM

## 2023-07-25 MED ORDER — WEGOVY 0.25 MG/0.5ML ~~LOC~~ SOAJ: 0.2500 mg | SUBCUTANEOUS | 0 refills | Status: DC

## 2023-07-29 ENCOUNTER — Telehealth: Payer: Self-pay

## 2023-07-29 NOTE — Telephone Encounter (Signed)
 Patient's wife called stating that she spoke with their insurance about coverage for the Wegovy  and she she stated that insurance will not give it to the patient unless he is Type 2 diabetic. According to patient's blood work, his HBA1c was 6.1 so he is only pre-diabetic. Would any other GLP 1's be covered for pre-diabetes through [facility]?

## 2023-07-30 ENCOUNTER — Encounter: Payer: Self-pay | Admitting: Physician Assistant

## 2023-07-31 ENCOUNTER — Other Ambulatory Visit: Payer: Self-pay | Admitting: Physician Assistant

## 2023-07-31 DIAGNOSIS — E66811 Obesity, class 1: Secondary | ICD-10-CM

## 2023-07-31 DIAGNOSIS — Z7689 Persons encountering health services in other specified circumstances: Secondary | ICD-10-CM

## 2023-07-31 MED ORDER — TIRZEPATIDE-WEIGHT MANAGEMENT 2.5 MG/0.5ML ~~LOC~~ SOLN: 2.5000 mg | SUBCUTANEOUS | 0 refills | Status: DC

## 2023-08-04 ENCOUNTER — Other Ambulatory Visit: Payer: Self-pay | Admitting: Physician Assistant

## 2023-08-04 DIAGNOSIS — E6609 Other obesity due to excess calories: Secondary | ICD-10-CM

## 2023-08-04 DIAGNOSIS — I679 Cerebrovascular disease, unspecified: Secondary | ICD-10-CM

## 2023-08-04 DIAGNOSIS — Z7689 Persons encountering health services in other specified circumstances: Secondary | ICD-10-CM

## 2023-08-04 MED ORDER — TIRZEPATIDE-WEIGHT MANAGEMENT 2.5 MG/0.5ML ~~LOC~~ SOLN: 2.5000 mg | SUBCUTANEOUS | 0 refills | Status: DC

## 2023-09-01 ENCOUNTER — Telehealth: Payer: Self-pay

## 2023-09-01 NOTE — Telephone Encounter (Signed)
 Copied from CRM [id number]. Topic: Clinical - Medication Question >> Sep 01, 2023  4:29 PM Leory Rands wrote: Reason for CRM: Patient is calling to request an increase in dosage for tirzepatide (ZEPBOUND) 2.5 MG/0.5ML injection vial [[id number]] to be sent to [id number] - [place], [place] - [address] [place] [place] [address] Phone: [phone]  Fax: [phone] DEA #: [id number]

## 2023-09-02 ENCOUNTER — Other Ambulatory Visit: Payer: Self-pay | Admitting: Physician Assistant

## 2023-09-02 DIAGNOSIS — Z7689 Persons encountering health services in other specified circumstances: Secondary | ICD-10-CM

## 2023-09-02 DIAGNOSIS — E6609 Other obesity due to excess calories: Secondary | ICD-10-CM

## 2023-09-02 MED ORDER — TIRZEPATIDE-WEIGHT MANAGEMENT 5 MG/0.5ML ~~LOC~~ SOLN: 5.0000 mg | SUBCUTANEOUS | 1 refills | Status: DC

## 2023-09-02 NOTE — Telephone Encounter (Signed)
 Patient was informed.

## 2023-09-04 ENCOUNTER — Other Ambulatory Visit: Payer: Self-pay | Admitting: Physician Assistant

## 2023-09-04 MED ORDER — LOVASTATIN 10 MG PO TABS: 10.0000 mg | ORAL_TABLET | Freq: Every day | ORAL | 3 refills | Status: AC

## 2023-09-04 MED ORDER — METOPROLOL SUCCINATE ER 25 MG PO TB24: 25.0000 mg | ORAL_TABLET | Freq: Every day | ORAL | 3 refills | Status: AC

## 2023-09-04 MED ORDER — OMEPRAZOLE 20 MG PO CPDR: 20.0000 mg | DELAYED_RELEASE_CAPSULE | Freq: Every day | ORAL | 3 refills | Status: AC

## 2023-09-04 NOTE — Telephone Encounter (Signed)
 Pt was instructed to contact PCP by the pharmacy.   Copied from CRM [id number]. Topic: Clinical - Medication Refill >> Sep 04, 2023  8:38 AM Bearl Botts E wrote: Medication:   omeprazole (PRILOSEC) 20 MG capsule lovastatin (MEVACOR) 10 MG tablet metoprolol succinate (TOPROL-XL) 25 MG 24 hr tablet  Has the patient contacted their pharmacy? Yes (Agent: If no, request that the patient contact the pharmacy for the refill. If patient does not wish to contact the pharmacy document the reason why and proceed with request.) (Agent: If yes, when and what did the pharmacy advise?)  This is the patient's preferred pharmacy:  [facility] - [place], [place] - [address] [place] [address] Phone: [phone] Fax: [phone]  Is this the correct pharmacy for this prescription? Yes If no, delete pharmacy and type the correct one.   Has the prescription been filled recently? Yes  Is the patient out of the medication? No  Has the patient been seen for an appointment in the last year OR does the patient have an upcoming appointment? Yes  Can we respond through MyChart? Yes  Agent: Please be advised that Rx refills may take up to 3 business days. We ask that you follow-up with your pharmacy.

## 2023-09-04 NOTE — Telephone Encounter (Signed)
 Last Fill: Omeprazole: Unk, Historical Provider     Lovastatin: Unk, Historical Provider     Metoprolol: Unk, Historical Provider   Last OV: 07/03/23 Next OV: 10/06/23  Routing to provider for review/authorization.

## 2023-10-06 ENCOUNTER — Ambulatory Visit: Admitting: Physician Assistant

## 2023-10-06 ENCOUNTER — Encounter: Payer: Self-pay | Admitting: Physician Assistant

## 2023-10-06 VITALS — BP 136/84 | HR 72 | Temp 98.2°F | Ht 68.0 in | Wt 203.0 lb

## 2023-10-06 DIAGNOSIS — E782 Mixed hyperlipidemia: Secondary | ICD-10-CM | POA: Diagnosis not present

## 2023-10-06 DIAGNOSIS — R7989 Other specified abnormal findings of blood chemistry: Secondary | ICD-10-CM | POA: Insufficient documentation

## 2023-10-06 DIAGNOSIS — I10 Essential (primary) hypertension: Secondary | ICD-10-CM

## 2023-10-06 NOTE — Patient Instructions (Signed)
 VISIT SUMMARY:  Today, you came in for weight management and to follow up on your cholesterol levels. You have been taking Zepbound  and have lost 25 pounds since April. We discussed your weight goals, cholesterol levels, and testosterone  levels. We also reviewed your recent Cologuard test and general health maintenance.  YOUR PLAN:  -OBESITY: Obesity means having an excess amount of body fat. You have lost 25 pounds and now weigh 203 pounds. Your goal is to reach 170-180 pounds, focusing on increasing lean muscle mass. Continue taking Zepbound  for weight management, increase your physical activity by considering jogging or weight training, and follow a diet with lean foods, fruits, and vegetables.  -HYPERLIPIDEMIA: Hyperlipidemia means having high levels of fats (lipids) in your blood, such as LDL cholesterol. Your LDL cholesterol is above 100 mg/dL. We discussed lowering your LDL to reduce cardiovascular risk. We will order blood work to assess your cholesterol levels and monitor them. If there is no improvement with weight loss, we may consider medication. It's also important to control your blood pressure.  -LOW TESTOSTERONE : Low testosterone  means having lower than normal levels of the male hormone testosterone . We discussed the benefits of exercise, supplements, and testosterone  creams or injections. We will order blood work to assess your testosterone  levels and consider testosterone  cream or injections if your levels remain low.  -GENERAL HEALTH MAINTENANCE: You completed a Cologuard test six months ago. We will check the results of this test and schedule blood work to monitor your overall health parameters.  INSTRUCTIONS:  Please follow up with the blood work to assess your cholesterol and testosterone  levels. Continue with your current weight management plan and increase your physical activity. Maintain a healthy diet and monitor your blood pressure regularly.

## 2023-10-06 NOTE — Assessment & Plan Note (Signed)
 Low testosterone  levels. Discussed benefits of exercise, supplements, and testosterone  creams or injections. - Order blood work to assess testosterone  levels. - Consider testosterone  cream or injections if levels remain low.

## 2023-10-06 NOTE — Assessment & Plan Note (Signed)
 Hypertension well-controlled at home, occasional clinic elevations likely due to white coat syndrome. Reports occasional low readings possibly from not eating post-medication. Metoprolol  used for management. Claritin D may increase blood pressure. - Continue metoprolol . - Monitor blood pressure at home, report consistent high/low readings. BP Readings from Last 3 Encounters:  10/06/23 136/84  07/03/23 132/88  06/07/22 132/80

## 2023-10-06 NOTE — Assessment & Plan Note (Signed)
 LDL cholesterol above 100 mg/dL. Discussed lowering LDL to reduce cardiovascular risk. Explained potential benefits of in-depth cholesterol panels and emphasized blood pressure control. - Order blood work to assess cholesterol levels. - Monitor cholesterol, consider medication if no improvement with weight loss. - Emphasize blood pressure control. Lab Results  Component Value Date   LDLCALC 146 (H) 07/03/2023

## 2023-10-06 NOTE — Progress Notes (Signed)
 Subjective:  Patient ID: Timothy Livingston, male    DOB: April 01, 1971  Age: 53 y.o. MRN: [id number]  Chief Complaint  Patient presents with   Medical Management of Chronic Issues    HPI:  He is currently taking Lipitor, omeprazole , metoprolol , and Lysteda. He also takes Claritin D for seasonal allergies, which he notes can sometimes increase his blood pressure. He monitors his blood pressure at home and reports it is generally within normal limits unless he becomes emotional or anxious.   Discussed the use of AI scribe software for clinical note transcription with the patient, who gave verbal consent to proceed.  History of Present Illness   Timothy Livingston is a 53 year old male who presents for weight management and follow-up on cholesterol levels.  He has been taking Zepbound  5 mg daily for a little over a month, experiencing mild nausea that does not require additional medication. He has lost 25 pounds since April, currently weighing 203 pounds, down from 227 pounds. He experienced a temporary weight gain over the Fourth of July due to consuming deep-fried and greasy foods. He has seen his weight at 196 pounds when weighing at home, typically after showering.  He discusses his weight goals, considering his height of 5'7 to 5'8. He recalls weighing around 160 pounds in high school and is aiming for a weight of 170 to 180 pounds, focusing on increasing lean muscle mass rather than just reducing BMI.  He completed a Cologuard test approximately six months ago with no abnormal bowel movements, blood, or mucus. No worsening fatigue or headaches. His testosterone  levels were previously low.  His cholesterol levels were previously noted to be high, with LDL cholesterol at 146. No pressure pain, abnormal bowel movements, blood or mucus in stools, worsening fatigue, or headaches.          07/03/2023   11:20 AM  Depression screen PHQ 2/9  Decreased Interest 0  Down, Depressed, Hopeless 0  PHQ -  2 Score 0        07/03/2023   11:19 AM  Fall Risk   Falls in the past year? 0  Number falls in past yr: 0  Injury with Fall? 0  Risk for fall due to : No Fall Risks  Follow up Falls evaluation completed    Patient Care Team: Milon Cleaves, [place] as PCP - General (Physician Assistant)   Review of Systems  Constitutional:  Negative for chills, diaphoresis, fatigue and fever.  HENT:  Negative for congestion, ear pain and sore throat.   Respiratory:  Negative for cough and shortness of breath.   Cardiovascular:  Negative for chest pain and leg swelling.  Gastrointestinal:  Positive for nausea. Negative for abdominal pain, constipation, diarrhea and vomiting.  Genitourinary:  Negative for dysuria and urgency.  Musculoskeletal:  Negative for arthralgias and myalgias.  Neurological:  Negative for dizziness and headaches.  Psychiatric/Behavioral:  Negative for dysphoric mood.     Current Outpatient Medications on File Prior to Visit  Medication Sig Dispense Refill   lovastatin  (MEVACOR ) 10 MG tablet Take 1 tablet (10 mg total) by mouth at bedtime. 90 tablet 3   metoprolol  succinate (TOPROL -XL) 25 MG 24 hr tablet Take 1 tablet (25 mg total) by mouth daily. 90 tablet 3   omeprazole  (PRILOSEC) 20 MG capsule Take 1 capsule (20 mg total) by mouth daily. 90 capsule 3   tirzepatide  5 MG/0.5ML injection vial Inject 5 mg into the skin once a week. 2 mL 1  No current facility-administered medications on file prior to visit.   Past Medical History:  Diagnosis Date   GERD (gastroesophageal reflux disease)    High cholesterol    Hypertension    Past Surgical History:  Procedure Laterality Date   Fractured nose     surgery to straighten    Family History  Problem Relation Age of Onset   Skin cancer Mother    Hyperlipidemia Father    COPD Father    Cancer Father    Stroke Maternal Grandfather    Colon cancer Neg Hx    Stomach cancer Neg Hx    Esophageal cancer Neg Hx    Colon polyps Neg  Hx    Rectal cancer Neg Hx    Social History   Socioeconomic History   Marital status: Married    Spouse name: Not on file   Number of children: 4   Years of education: 14   Highest education level: Not on file  Occupational History   Occupation: Field seismologist shop   Occupation: self employed Surveyor, minerals  Tobacco Use   Smoking status: Never   Smokeless tobacco: Never  Advertising account planner   Vaping status: Never Used  Substance and Sexual Activity   Alcohol use: No   Drug use: Never   Sexual activity: Not Currently  Other Topics Concern   Not on file  Social History Narrative   Lives at home w/ his wife and 4 children   Social Drivers of Corporate investment banker Strain: Low Risk  (10/06/2023)   Overall Financial Resource Strain (CARDIA)    Difficulty of Paying Living Expenses: Not hard at all  Food Insecurity: No Food Insecurity (10/06/2023)   Hunger Vital Sign    Worried About Running Out of Food in the Last Year: Never true    Ran Out of Food in the Last Year: Never true  Transportation Needs: No Transportation Needs (10/06/2023)   PRAPARE - Administrator, Civil Service (Medical): No    Lack of Transportation (Non-Medical): No  Physical Activity: Insufficiently Active (10/06/2023)   Exercise Vital Sign    Days of Exercise per Week: 3 days    Minutes of Exercise per Session: 30 min  Stress: No Stress Concern Present (10/06/2023)   [employer] of Occupational Health - Occupational Stress Questionnaire    Feeling of Stress: Not at all  Social Connections: Moderately Integrated (10/06/2023)   Social Connection and Isolation Panel    Frequency of Communication with Friends and Family: More than three times a week    Frequency of Social Gatherings with Friends and Family: More than three times a week    Attends Religious Services: More than 4 times per year    Active Member of [employer] or Organizations: No    Attends Banker Meetings: Never    Marital  Status: Married    Objective:  BP 136/84   Pulse 72   Temp 98.2 F (36.8 C)   Ht 5' 8 (1.727 m)   Wt 203 lb (92.1 kg)   SpO2 96%   BMI 30.87 kg/m      10/06/2023    7:52 AM 07/03/2023   11:20 AM 10/10/2022    8:04 AM  BP/Weight  Systolic BP 136 132   Diastolic BP 84 88   Wt. (Lbs) 203 227 220  BMI 30.87 kg/m2 34.52 kg/m2 33.45 kg/m2    Physical Exam Vitals reviewed.  Constitutional:      Appearance: Normal  appearance.  Cardiovascular:     Rate and Rhythm: Normal rate and regular rhythm.     Heart sounds: Normal heart sounds.  Pulmonary:     Effort: Pulmonary effort is normal.     Breath sounds: Normal breath sounds.  Abdominal:     General: Bowel sounds are normal.     Palpations: Abdomen is soft.     Tenderness: There is no abdominal tenderness.  Neurological:     Mental Status: He is alert and oriented to person, place, and time.  Psychiatric:        Mood and Affect: Mood normal.        Behavior: Behavior normal.         Lab Results  Component Value Date   WBC 6.5 07/03/2023   HGB 15.7 07/03/2023   HCT 47.5 07/03/2023   PLT 362 07/03/2023   GLUCOSE 119 (H) 07/03/2023   CHOL 213 (H) 07/03/2023   TRIG 167 (H) 07/03/2023   HDL 37 (L) 07/03/2023   LDLCALC 146 (H) 07/03/2023   ALT 57 (H) 07/03/2023   AST 36 07/03/2023   NA 141 07/03/2023   K 5.0 07/03/2023   CL 103 07/03/2023   CREATININE 0.97 07/03/2023   BUN 12 07/03/2023   CO2 24 07/03/2023   TSH 1.970 07/03/2023   HGBA1C 6.1 (H) 07/03/2023      Assessment & Plan:  Mixed hyperlipidemia Assessment & Plan: LDL cholesterol above 100 mg/dL. Discussed lowering LDL to reduce cardiovascular risk. Explained potential benefits of in-depth cholesterol panels and emphasized blood pressure control. - Order blood work to assess cholesterol levels. - Monitor cholesterol, consider medication if no improvement with weight loss. - Emphasize blood pressure control. Lab Results  Component Value Date    LDLCALC 146 (H) 07/03/2023     Orders: -     Lipid panel  Essential hypertension Assessment & Plan: Hypertension well-controlled at home, occasional clinic elevations likely due to white coat syndrome. Reports occasional low readings possibly from not eating post-medication. Metoprolol  used for management. Claritin D may increase blood pressure. - Continue metoprolol . - Monitor blood pressure at home, report consistent high/low readings. BP Readings from Last 3 Encounters:  10/06/23 136/84  07/03/23 132/88  06/07/22 132/80     Orders: -     CBC with Differential/Platelet -     Comprehensive metabolic panel with GFR -     Hemoglobin A1c -     Testosterone ,Free and Total  Low testosterone  Assessment & Plan: Low testosterone  levels. Discussed benefits of exercise, supplements, and testosterone  creams or injections. - Order blood work to assess testosterone  levels. - Consider testosterone  cream or injections if levels remain low.     General Health Maintenance Completed Cologuard test six months ago. - Check Cologuard test results. - Schedule blood work to monitor health parameters.      No orders of the defined types were placed in this encounter.   Orders Placed This Encounter  Procedures   CBC with Differential/Platelet   Comprehensive metabolic panel with GFR   Hemoglobin A1c   Lipid panel   Testosterone ,Free and Total     Follow-up: Return in about 6 months (around 04/07/2024) for Chronic, Nola, lab visit.   I,Katherina A Bramblett,acting as a scribe for [employer], PA.,have documented all relevant documentation on the behalf of Nola Angles, PA,as directed by  Nola Angles, PA while in the presence of Nola Angles, [place].   An After Visit Summary was printed and given to the  patient.  Nola Angles, [place] [facility] [phone]

## 2023-10-07 LAB — CBC WITH DIFFERENTIAL/PLATELET
Basophils Absolute: 0.1 x10E3/uL (ref 0.0–0.2)
Basos: 2 %
EOS (ABSOLUTE): 0.2 x10E3/uL (ref 0.0–0.4)
Eos: 4 %
Hematocrit: 46.5 % (ref 37.5–51.0)
Hemoglobin: 15.5 g/dL (ref 13.0–17.7)
Immature Grans (Abs): 0 x10E3/uL (ref 0.0–0.1)
Immature Granulocytes: 0 %
Lymphocytes Absolute: 1.6 x10E3/uL (ref 0.7–3.1)
Lymphs: 32 %
MCH: 30.3 pg (ref 26.6–33.0)
MCHC: 33.3 g/dL (ref 31.5–35.7)
MCV: 91 fL (ref 79–97)
Monocytes Absolute: 0.6 x10E3/uL (ref 0.1–0.9)
Monocytes: 12 %
Neutrophils Absolute: 2.6 x10E3/uL (ref 1.4–7.0)
Neutrophils: 50 %
Platelets: 316 x10E3/uL (ref 150–450)
RBC: 5.12 x10E6/uL (ref 4.14–5.80)
RDW: 13 % (ref 11.6–15.4)
WBC: 5.2 x10E3/uL (ref 3.4–10.8)

## 2023-10-07 LAB — COMPREHENSIVE METABOLIC PANEL WITH GFR
ALT: 53 IU/L — ABNORMAL HIGH (ref 0–44)
AST: 40 IU/L (ref 0–40)
Albumin: 4.8 g/dL (ref 3.8–4.9)
Alkaline Phosphatase: 96 IU/L (ref 44–121)
BUN/Creatinine Ratio: 12 (ref 9–20)
BUN: 12 mg/dL (ref 6–24)
Bilirubin Total: 0.4 mg/dL (ref 0.0–1.2)
CO2: 20 mmol/L (ref 20–29)
Calcium: 9.9 mg/dL (ref 8.7–10.2)
Chloride: 105 mmol/L (ref 96–106)
Creatinine, Ser: 0.97 mg/dL (ref 0.76–1.27)
Globulin, Total: 2.6 g/dL (ref 1.5–4.5)
Glucose: 109 mg/dL — ABNORMAL HIGH (ref 70–99)
Potassium: 4.9 mmol/L (ref 3.5–5.2)
Sodium: 141 mmol/L (ref 134–144)
Total Protein: 7.4 g/dL (ref 6.0–8.5)
eGFR: 92 mL/min/1.73 (ref >59)

## 2023-10-07 LAB — LIPID PANEL
Chol/HDL Ratio: 4.6 ratio (ref 0.0–5.0)
Cholesterol, Total: 160 mg/dL (ref 100–199)
HDL: 35 mg/dL — ABNORMAL LOW (ref >39)
LDL Chol Calc (NIH): 104 mg/dL — ABNORMAL HIGH (ref 0–99)
Triglycerides: 114 mg/dL (ref 0–149)
VLDL Cholesterol Cal: 21 mg/dL (ref 5–40)

## 2023-10-07 LAB — TESTOSTERONE,FREE AND TOTAL
Testosterone, Free: 5.1 pg/mL — ABNORMAL LOW (ref 7.2–24.0)
Testosterone: 362 ng/dL (ref 264–916)

## 2023-10-07 LAB — HEMOGLOBIN A1C
Est. average glucose Bld gHb Est-mCnc: 108 mg/dL
Hgb A1c MFr Bld: 5.4 % (ref 4.8–5.6)

## 2023-10-08 ENCOUNTER — Other Ambulatory Visit: Payer: Self-pay | Admitting: Physician Assistant

## 2023-10-08 DIAGNOSIS — Z7689 Persons encountering health services in other specified circumstances: Secondary | ICD-10-CM

## 2023-10-08 DIAGNOSIS — E6609 Other obesity due to excess calories: Secondary | ICD-10-CM

## 2023-10-08 MED ORDER — TIRZEPATIDE-WEIGHT MANAGEMENT 5 MG/0.5ML ~~LOC~~ SOLN: 5.0000 mg | SUBCUTANEOUS | 1 refills | Status: DC

## 2023-10-09 ENCOUNTER — Ambulatory Visit: Payer: Self-pay | Admitting: Physician Assistant

## 2023-10-09 DIAGNOSIS — R7989 Other specified abnormal findings of blood chemistry: Secondary | ICD-10-CM

## 2023-10-09 MED ORDER — TESTOSTERONE CYPIONATE 200 MG/ML IM SOLN: 100.0000 mg | INTRAMUSCULAR | 0 refills | Status: AC

## 2023-10-12 ENCOUNTER — Other Ambulatory Visit: Payer: Self-pay | Admitting: Physician Assistant

## 2023-10-15 ENCOUNTER — Ambulatory Visit

## 2023-10-16 ENCOUNTER — Ambulatory Visit (INDEPENDENT_AMBULATORY_CARE_PROVIDER_SITE_OTHER)

## 2023-10-16 DIAGNOSIS — R7989 Other specified abnormal findings of blood chemistry: Secondary | ICD-10-CM

## 2023-10-16 MED ORDER — TESTOSTERONE CYPIONATE 200 MG/ML IM SOLN
100.0000 mg | Freq: Once | INTRAMUSCULAR | Status: AC
  Administered 2023-10-16: 100 mg via INTRAMUSCULAR

## 2023-10-16 NOTE — Progress Notes (Signed)
 Patient is here for testosterone  shot. He tolerates well injection

## 2023-11-21 ENCOUNTER — Encounter: Payer: Self-pay | Admitting: Physician Assistant

## 2023-12-05 ENCOUNTER — Encounter: Payer: Self-pay | Admitting: Physician Assistant

## 2023-12-08 ENCOUNTER — Other Ambulatory Visit: Payer: Self-pay

## 2023-12-08 ENCOUNTER — Other Ambulatory Visit: Payer: Self-pay | Admitting: Physician Assistant

## 2023-12-08 DIAGNOSIS — E66811 Obesity, class 1: Secondary | ICD-10-CM

## 2023-12-08 MED ORDER — TIRZEPATIDE 7.5 MG/0.5ML ~~LOC~~ SOAJ: 7.5000 mg | SUBCUTANEOUS | 0 refills | Status: DC

## 2024-01-07 ENCOUNTER — Other Ambulatory Visit: Payer: Self-pay | Admitting: Physician Assistant

## 2024-01-07 DIAGNOSIS — E6609 Other obesity due to excess calories: Secondary | ICD-10-CM

## 2024-01-07 DIAGNOSIS — Z7689 Persons encountering health services in other specified circumstances: Secondary | ICD-10-CM

## 2024-02-04 ENCOUNTER — Encounter: Payer: Self-pay | Admitting: Physician Assistant

## 2024-02-04 ENCOUNTER — Other Ambulatory Visit: Payer: Self-pay

## 2024-02-04 ENCOUNTER — Other Ambulatory Visit: Payer: Self-pay | Admitting: Physician Assistant

## 2024-02-04 DIAGNOSIS — E66811 Other obesity due to excess calories: Secondary | ICD-10-CM

## 2024-02-04 MED ORDER — ZEPBOUND 5 MG/0.5ML ~~LOC~~ SOAJ: 5.0000 mg | SUBCUTANEOUS | 1 refills | Status: DC

## 2024-02-04 MED ORDER — TIRZEPATIDE-WEIGHT MANAGEMENT 7.5 MG/0.5ML ~~LOC~~ SOAJ: 7.5000 mg | SUBCUTANEOUS | 0 refills | Status: DC

## 2024-03-31 ENCOUNTER — Other Ambulatory Visit: Payer: Self-pay | Admitting: Physician Assistant

## 2024-03-31 DIAGNOSIS — E6609 Other obesity due to excess calories: Secondary | ICD-10-CM

## 2024-04-10 ENCOUNTER — Other Ambulatory Visit: Payer: Self-pay

## 2024-04-10 DIAGNOSIS — E782 Mixed hyperlipidemia: Secondary | ICD-10-CM

## 2024-04-10 DIAGNOSIS — I10 Essential (primary) hypertension: Secondary | ICD-10-CM

## 2024-04-12 ENCOUNTER — Other Ambulatory Visit

## 2024-04-12 DIAGNOSIS — E782 Mixed hyperlipidemia: Secondary | ICD-10-CM

## 2024-04-12 DIAGNOSIS — I10 Essential (primary) hypertension: Secondary | ICD-10-CM

## 2024-04-14 LAB — CBC WITH DIFFERENTIAL/PLATELET
Basophils Absolute: 0.1 x10E3/uL (ref 0.0–0.2)
Basos: 1 %
EOS (ABSOLUTE): 0.2 x10E3/uL (ref 0.0–0.4)
Eos: 2 %
Hematocrit: 44.6 % (ref 37.5–51.0)
Hemoglobin: 14.7 g/dL (ref 13.0–17.7)
Immature Grans (Abs): 0 x10E3/uL (ref 0.0–0.1)
Immature Granulocytes: 0 %
Lymphocytes Absolute: 2.6 x10E3/uL (ref 0.7–3.1)
Lymphs: 32 %
MCH: 29.8 pg (ref 26.6–33.0)
MCHC: 33 g/dL (ref 31.5–35.7)
MCV: 90 fL (ref 79–97)
Monocytes Absolute: 0.9 x10E3/uL (ref 0.1–0.9)
Monocytes: 11 %
Neutrophils Absolute: 4.4 x10E3/uL (ref 1.4–7.0)
Neutrophils: 53 %
Platelets: 388 x10E3/uL (ref 150–450)
RBC: 4.94 x10E6/uL (ref 4.14–5.80)
RDW: 12.8 % (ref 11.6–15.4)
WBC: 8.1 x10E3/uL (ref 3.4–10.8)

## 2024-04-14 LAB — COMPREHENSIVE METABOLIC PANEL WITH GFR
ALT: 34 IU/L (ref 0–44)
AST: 27 IU/L (ref 0–40)
Albumin: 4.7 g/dL (ref 3.8–4.9)
Alkaline Phosphatase: 91 IU/L (ref 47–123)
BUN/Creatinine Ratio: 10 (ref 9–20)
BUN: 10 mg/dL (ref 6–24)
Bilirubin Total: 0.5 mg/dL (ref 0.0–1.2)
CO2: 22 mmol/L (ref 20–29)
Calcium: 9.5 mg/dL (ref 8.7–10.2)
Chloride: 105 mmol/L (ref 96–106)
Creatinine, Ser: 0.99 mg/dL (ref 0.76–1.27)
Globulin, Total: 2.2 g/dL (ref 1.5–4.5)
Glucose: 91 mg/dL (ref 70–99)
Potassium: 5.1 mmol/L (ref 3.5–5.2)
Sodium: 140 mmol/L (ref 134–144)
Total Protein: 6.9 g/dL (ref 6.0–8.5)
eGFR: 90 mL/min/1.73

## 2024-04-14 LAB — HEMOGLOBIN A1C
Est. average glucose Bld gHb Est-mCnc: 103 mg/dL
Hgb A1c MFr Bld: 5.2 % (ref 4.8–5.6)

## 2024-04-14 LAB — LIPID PANEL
Chol/HDL Ratio: 5.6 ratio — ABNORMAL HIGH (ref 0.0–5.0)
Cholesterol, Total: 223 mg/dL — ABNORMAL HIGH (ref 100–199)
HDL: 40 mg/dL
LDL Chol Calc (NIH): 154 mg/dL — ABNORMAL HIGH (ref 0–99)
Triglycerides: 158 mg/dL — ABNORMAL HIGH (ref 0–149)
VLDL Cholesterol Cal: 29 mg/dL (ref 5–40)

## 2024-04-14 LAB — TESTOSTERONE,FREE AND TOTAL
Testosterone, Free: 0.7 pg/mL — ABNORMAL LOW (ref 7.2–24.0)
Testosterone: 71 ng/dL — ABNORMAL LOW (ref 264–916)

## 2024-04-16 ENCOUNTER — Ambulatory Visit: Admitting: Physician Assistant

## 2024-04-19 ENCOUNTER — Ambulatory Visit: Payer: Self-pay | Admitting: Physician Assistant
